# Patient Record
Sex: Male | Born: 1995 | Hispanic: No | Smoking: Never smoker
Health system: Southern US, Community
[De-identification: ages and names within clinical notes are randomized; demographics above are authoritative.]

---

## 2020-12-13 ENCOUNTER — Ambulatory Visit: Admit: 2020-12-13 | Discharge status: Absent without leave | Payer: Self-pay

## 2020-12-14 ENCOUNTER — Ambulatory Visit: Payer: Self-pay

## 2020-12-25 ENCOUNTER — Ambulatory Visit: Payer: Self-pay | Admitting: General Surgery

## 2021-01-30 ENCOUNTER — Institutional Professional Consult (permissible substitution): Payer: 59 | Admitting: Plastic Surgery

## 2021-07-04 ENCOUNTER — Ambulatory Visit (INDEPENDENT_AMBULATORY_CARE_PROVIDER_SITE_OTHER): Payer: 59 | Admitting: Orthopaedic Surgery

## 2021-07-04 ENCOUNTER — Other Ambulatory Visit: Payer: Self-pay

## 2021-07-04 ENCOUNTER — Encounter: Payer: Self-pay | Admitting: Orthopaedic Surgery

## 2021-07-04 ENCOUNTER — Ambulatory Visit: Payer: Self-pay

## 2021-07-04 VITALS — Ht 71.0 in | Wt 190.0 lb

## 2021-07-04 DIAGNOSIS — M545 Low back pain, unspecified: Secondary | ICD-10-CM | POA: Diagnosis not present

## 2021-07-04 MED ORDER — PREDNISONE 10 MG (21) PO TBPK: ORAL_TABLET | ORAL | 0 refills | Status: DC

## 2021-07-04 NOTE — Progress Notes (Signed)
Office Visit Note   Patient: Corey Holland           Date of Birth: 14-Oct-1995           MRN: [id number] Visit Date: 07/04/2021              Requested by: No referring provider defined for this encounter. PCP: Pcp, No   Assessment & Plan: Visit Diagnoses:  1. Acute left-sided low back pain, unspecified whether sciatica present     Plan: We will set patient up for some physical therapy evaluation and treatment for low back pain with spasms.  Recheck 4 weeks.  Follow-Up Instructions: Return in about 4 weeks (around 08/01/2021).   Orders:  Orders Placed This Encounter  Procedures   XR Lumbar Spine 2-3 Views   Meds ordered this encounter  Medications   predniSONE (STERAPRED UNI-PAK 21 TAB) 10 MG (21) TBPK tablet    Sig: TAKE 6,5,4,3,2,1 ONE TABLET LESS EACH DAY WITH FOOD.    Dispense:  21 tablet    Refill:  0      Procedures: No procedures performed   Clinical Data: No additional findings.   Subjective: Chief Complaint  Patient presents with   Lower Back - Pain    HPI 25 year old male seen with 1/55-month history of left radicular pain with low back pain.  He had more back pain now has more left calf pain.  He has had some trouble in his back in the past but nothing as severe or as sustained.  He is taken muscle relaxants ice over-the-counter medication without relief.  No bowel bladder symptoms no falling no chills or fever he got some relief with prednisone.  He rates his pain is moderate at times severe.  He spent multiple days in bed which tend to help.  No weight loss no smoking does not drink no cancer history.  Review of Systems all the systems are noncontributory.   Objective: Vital Signs: Ht 5\' 11"  (1.803 m)   Wt 190 lb (86.2 kg)   BMI 26.50 kg/m   Physical Exam Constitutional:      Appearance: He is well-developed.  HENT:     Head: Normocephalic and atraumatic.     Right Ear: External ear normal.     Left Ear: External ear normal.  Eyes:      Pupils: Pupils are equal, round, and reactive to light.  Neck:     Thyroid: No thyromegaly.     Trachea: No tracheal deviation.  Cardiovascular:     Rate and Rhythm: Normal rate.  Pulmonary:     Effort: Pulmonary effort is normal.     Breath sounds: No wheezing.  Abdominal:     General: Bowel sounds are normal.     Palpations: Abdomen is soft.  Musculoskeletal:     Cervical back: Neck supple.  Skin:    General: Skin is warm and dry.     Capillary Refill: Capillary refill takes less than 2 seconds.  Neurological:     Mental Status: He is alert and oriented to person, place, and time.  Psychiatric:        Behavior: Behavior normal.        Thought Content: Thought content normal.        Judgment: Judgment normal.    Ortho Exam patient can heel and toe walk.  Anterior tib gastrocsoleus is strong.  He has some tenderness over the left sacroiliac joint.  Negative FABER test negative logroll the hips knee and  ankle jerk are intact.  Sensation is intact over the dorsum of the foot.  No limitation of hip internal rotation.  Specialty Comments:  No specialty comments available.  Imaging: No results found.   PMFS History: There are no problems to display for this patient.  No past medical history on file.  No family history on file.   Social History   Occupational History   Not on file  Tobacco Use   Smoking status: Never   Smokeless tobacco: Never  Substance and Sexual Activity   Alcohol use: Not Currently   Drug use: Not on file   Sexual activity: Not on file

## 2021-07-11 ENCOUNTER — Encounter (HOSPITAL_COMMUNITY): Payer: Self-pay | Admitting: Physical Therapy

## 2021-07-11 ENCOUNTER — Other Ambulatory Visit: Payer: Self-pay

## 2021-07-11 ENCOUNTER — Ambulatory Visit (HOSPITAL_COMMUNITY): Payer: 59 | Attending: Orthopaedic Surgery | Admitting: Physical Therapy

## 2021-07-11 DIAGNOSIS — M545 Low back pain, unspecified: Secondary | ICD-10-CM | POA: Insufficient documentation

## 2021-07-11 NOTE — Patient Instructions (Signed)
Access Code: [email or web address] URL: [email or web address] Date: 07/11/2021 Prepared by: Georges Lynch  Exercises Prone Press Up - 3 x daily - 7 x weekly - 2 sets - 10 reps Supine 90/90 Sciatic Nerve Glide with Knee Flexion/Extension - 3 x daily - 7 x weekly - 2 sets - 10 reps Clamshell - 3 x daily - 7 x weekly - 2 sets - 10 reps Supine Figure 4 Piriformis Stretch - 3 x daily - 7 x weekly - 1 sets - 3 reps - 30 second hold

## 2021-07-11 NOTE — Therapy (Signed)
[facility] [address] [place], [place], [address] Phone: [phone]   Fax:  [phone]  Physical Therapy Evaluation  Patient Details  Name: Corey Holland MRN: [id number] Date of Birth: 1996-02-23 Referring Provider (PT): Annell Greening MD   Encounter Date: 07/11/2021   PT End of Session - 07/11/21 1617     Visit Number 1    Number of Visits 8    Date for PT Re-Evaluation 08/08/21    Authorization Type [employer] (30 VL)    Authorization - Visit Number 1    Authorization - Number of Visits 30    PT Start Time [phone]    PT Stop Time 1035    PT Time Calculation (min) 47 min    Activity Tolerance Patient tolerated treatment well    Behavior During Therapy [facility] for tasks assessed/performed             History reviewed. No pertinent past medical history.  History reviewed. No pertinent surgical history.  There were no vitals filed for this visit.    Subjective Assessment - 07/11/21 0958     Subjective Patient presents to therapy with complaint of LBP. He reports about 3 months ago he went on a trip to [place] and when he returned he had back pain. His pain worsened over time. He notes the pain was the worst he ever had. He was prescribed muscle relaxers which did not help very much. It has gotten better sine this time but still painful. Still radiates to LT leg but not no longer down to calf. He was recently prescribed prednisone which he says has helped about 70%. He reports no other mechanism of injury.    Limitations Standing;Walking;House hold activities    Patient Stated Goals "Get as close to before this without the pain"    Currently in Pain? No/denies   No pain, just tightness in LT low back and LT calf               [facility] PT Assessment - 07/11/21 0001       Assessment   Medical Diagnosis LBP    Referring Provider (PT) Annell Greening MD    Prior Therapy No      Precautions   Precautions None      Balance Screen    Has the patient fallen in the past 6 months No      Home Environment   Living Environment Private residence      Prior Function   Level of Independence Independent    Vocation Full time employment    Vocation Requirements Operates online business      Cognition   Overall Cognitive Status Within Functional Limits for tasks assessed      Observation/Other Assessments   Focus on Therapeutic Outcomes (FOTO)  49% function      Posture/Postural Control   Posture/Postural Control Postural limitations    Postural Limitations Decreased lumbar lordosis      ROM / Strength   AROM / PROM / Strength AROM;Strength      AROM   Overall AROM Comments mod restriction in LT hip ER    AROM Assessment Site Lumbar    Lumbar Flexion 90% limited    Lumbar Extension 50% limited    Lumbar - Right Rotation WFL    Lumbar - Left Rotation [facility]      Strength   Strength Assessment Site Hip;Knee    Right/Left Hip Right;Left    Right Hip  Flexion 4+/5    Right Hip Extension 4/5   pain in lumbar   Right Hip ABduction 4/5    Left Hip Flexion 4+/5    Left Hip Extension 4/5   pain in lumbar   Left Hip ABduction 4-/5    Right/Left Knee Right;Left    Right Knee Extension 3-/5    Left Knee Extension 3-/5      Palpation   Palpation comment Min TTP about LT piriformis                        Objective measurements completed on examination: See above findings.       [facility] Adult PT Treatment/Exercise - 07/11/21 0001       Exercises   Exercises Lumbar      Lumbar Exercises: Stretches   Piriformis Stretch Left;2 reps;30 seconds   abduction stretch     Lumbar Exercises: Sidelying   Clam Left;20 reps      Lumbar Exercises: Quadruped   Other Quadruped Lumbar Exercises prone press ups 2 x 10 (improved)                     PT Education - 07/11/21 1002     Education Details on evaluation findings, POC and HEP    Person(s) Educated Patient    Methods Explanation;Handout     Comprehension Verbalized understanding              PT Short Term Goals - 07/11/21 1623       PT SHORT TERM GOAL #1   Title Patient will be independent with initial HEP and self-management strategies to improve functional outcomes    Time 2    Period Weeks    Status New    Target Date 07/25/21               PT Long Term Goals - 07/11/21 1623       PT LONG TERM GOAL #1   Title Patient will be independent with advance HEP and self-management strategies to improve functional outcomes    Time 4    Period Weeks    Status New    Target Date 08/08/21      PT LONG TERM GOAL #2   Title Patient will report at least 80% overall improvement in subjective complaint to indicate improvement in ability to perform ADLs.    Time 4    Period Weeks    Status New    Target Date 08/08/21      PT LONG TERM GOAL #3   Title Patient will improve FOTO score to predicted value to indicate improvement in functional outcomes    Time 4    Period Weeks    Status New    Target Date 08/08/21      PT LONG TERM GOAL #4   Title Patient will have equal to or > 4+/5 MMT throughout BLE to improve ability to perform functional mobility, stair ambulation and ADLs.    Time 4    Period Weeks    Status New    Target Date 08/08/21                    Plan - 07/11/21 1618     Clinical Impression Statement Patient is a 25 y.o. male who presents to physical therapy with complaint of LBP. Patient demonstrates decreased strength, ROM restriction, increased tenderness to palpation and postural abnormalities which are likely contributing  to symptoms of pain and are negatively impacting patient ability to perform ADLs and functional mobility tasks. Patient will benefit from skilled physical therapy services to address these deficits to reduce pain and improve level of function with ADLs and functional mobility tasks.    Examination-Activity Limitations Stand;Locomotion Level;Squat;Lift;Sit     Examination-Participation Restrictions Yard Work;Community Activity;Occupation    Stability/Clinical Decision Making Stable/Uncomplicated    Clinical Decision Making Low    Rehab Potential Good    PT Frequency 2x / week    PT Duration 4 weeks    PT Treatment/Interventions ADLs/Self Care Home Management;Aquatic Therapy;Biofeedback;Cryotherapy;Traction;Functional mobility training;Stair training;Neuromuscular re-education;Manual techniques;Passive range of motion;Dry needling;Energy conservation;Patient/family education;Therapeutic activities;Ultrasound;Parrafin;Fluidtherapy;Moist Heat;Iontophoresis 4mg /ml Dexamethasone;Gait training;DME Instruction;Contrast Bath;Electrical Stimulation;Therapeutic exercise;Balance training;Orthotic Fit/Training;Compression bandaging;Scar mobilization;Taping;Vasopneumatic Device;Visual/perceptual remediation/compensation;Spinal Manipulations;Joint Manipulations    PT Next Visit Plan Review HEP. Progress extension based activity for decreased lumbar pain as indicated. Core and glute strengthening as able.    PT Home Exercise Plan Eval: prone press ups, piriformis stretch, sciatic nerve glides, clams    Consulted and Agree with Plan of Care Patient             Patient will benefit from skilled therapeutic intervention in order to improve the following deficits and impairments:  Pain, Improper body mechanics, Abnormal gait, Increased fascial restricitons, Postural dysfunction, Decreased activity tolerance, Decreased range of motion, Decreased strength, Impaired flexibility, Difficulty walking  Visit Diagnosis: Low back pain, unspecified back pain laterality, unspecified chronicity, unspecified whether sciatica present     Problem List There are no problems to display for this patient.  4:27 PM, 07/11/21 13/10/22 PT DPT  Physical Therapist with [facility]  [facility]  [phone]   [facility] [address] [place], [place], [place] Phone: [phone]   Fax:  [phone]  Name: Osa Campoli MRN: Allene Pyo Date of Birth: 1996/02/24

## 2021-07-15 ENCOUNTER — Telehealth (HOSPITAL_COMMUNITY): Payer: Self-pay

## 2021-07-16 ENCOUNTER — Encounter (HOSPITAL_COMMUNITY): Payer: 59

## 2021-07-18 ENCOUNTER — Ambulatory Visit (HOSPITAL_COMMUNITY): Payer: 59 | Admitting: Physical Therapy

## 2021-07-22 ENCOUNTER — Ambulatory Visit
Admission: RE | Admit: 2021-07-22 | Discharge: 2021-07-22 | Disposition: A | Discharge status: Home / Self Care | Payer: 59 | Source: Ambulatory Visit | Attending: Internal Medicine | Admitting: Internal Medicine

## 2021-07-22 ENCOUNTER — Other Ambulatory Visit: Payer: Self-pay

## 2021-07-22 VITALS — BP 118/77 | HR 90 | Temp 99.0°F | Resp 16

## 2021-07-22 DIAGNOSIS — G8929 Other chronic pain: Secondary | ICD-10-CM

## 2021-07-22 DIAGNOSIS — M549 Dorsalgia, unspecified: Secondary | ICD-10-CM

## 2021-07-22 MED ORDER — PREDNISONE 10 MG (21) PO TBPK: ORAL_TABLET | ORAL | 0 refills | Status: DC

## 2021-07-22 NOTE — ED Triage Notes (Signed)
PT reports history of DDD. States he has been in bed for 3 months, per patient, he can hardly walk due to pain. Reports he would like to start physical therapy, but has been told he needs prednisone before starting.

## 2021-07-22 NOTE — Discharge Instructions (Addendum)
Please take medications as prescribed Follow up with the Physical therapist and Orthopedic Surgery for further evaluation.

## 2021-07-22 NOTE — ED Provider Notes (Signed)
[facility]    CSN: [id number] Arrival date & time: 07/22/21  1459      History   Chief Complaint Chief Complaint  Patient presents with   Appointment    Back pain    HPI Corey Holland is a 25 y.o. male with a history of degenerative disc disease comes to urgent care for evaluation.  Patient started physical therapy and he was advised to come for evaluation.  Patient has previously had great success with tapering dose of prednisone followed by physical therapy.  Over the past 3 months patient has had significant back pain.  He started physical therapy a week ago and was advised to come to urgent care to be evaluated and possibly giving steroids so he can continue with physical therapy.  This regimen has worked well for him in the past.  He was evaluated by orthopedic surgery earlier this month.  Orthopedic surgery recommended physical therapy for now.  No weakness in the lower extremities.  Pain is in the lower back and shoots down both legs.  No numbness or tingling.  No trauma to the back.  No bowel or bladder problems.Corey Holland   HPI  History reviewed. No pertinent past medical history.  There are no problems to display for this patient.   History reviewed. No pertinent surgical history.     Home Medications    Prior to Admission medications   Medication Sig Start Date End Date Taking? Authorizing Provider  predniSONE (STERAPRED UNI-PAK 21 TAB) 10 MG (21) TBPK tablet TAKE 6,5,4,3,2,1 ONE TABLET LESS EACH DAY WITH FOOD. 07/22/21   Ralpheal Zappone, Britta Mccreedy, MD    Family History No family history on file.  Social History Social History   Tobacco Use   Smoking status: Never   Smokeless tobacco: Never  Substance Use Topics   Alcohol use: Not Currently     Allergies   Patient has no known allergies.   Review of Systems Review of Systems As per HPI  Physical Exam Triage Vital Signs ED Triage Vitals  Enc Vitals Group     BP 07/22/21 1557 118/77     Pulse Rate  07/22/21 1557 90     Resp 07/22/21 1557 16     Temp 07/22/21 1557 99 F (37.2 C)     Temp Source 07/22/21 1557 Oral     SpO2 07/22/21 1557 98 %     Weight --      Height --      Head Circumference --      Peak Flow --      Pain Score 07/22/21 1554 8     Pain Loc --      Pain Edu? --      Excl. in GC? --    No data found.  Updated Vital Signs BP 118/77   Pulse 90   Temp 99 F (37.2 C) (Oral)   Resp 16   SpO2 98%   Visual Acuity Right Eye Distance:   Left Eye Distance:   Bilateral Distance:    Right Eye Near:   Left Eye Near:    Bilateral Near:     Physical Exam Vitals and nursing note reviewed.  Constitutional:      General: He is not in acute distress.    Appearance: He is not ill-appearing.  Cardiovascular:     Rate and Rhythm: Normal rate and regular rhythm.  Musculoskeletal:        General: No swelling, tenderness, deformity or signs of  injury. Normal range of motion.     Right lower leg: No edema.     Left lower leg: No edema.  Skin:    General: Skin is warm.  Neurological:     General: No focal deficit present.     Mental Status: He is alert and oriented to person, place, and time.     Motor: No weakness.     Gait: Gait normal.     Deep Tendon Reflexes: Reflexes normal.     UC Treatments / Results  Labs (all labs ordered are listed, but only abnormal results are displayed) Labs Reviewed - No data to display  EKG   Radiology No results found.  Procedures Procedures (including critical care time)  Medications Ordered in UC Medications - No data to display  Initial Impression / Assessment and Plan / UC Course  I have reviewed the triage vital signs and the nursing notes.  Pertinent labs & imaging results that were available during my care of the patient were reviewed by me and considered in my medical decision making (see chart for details).     1.  Chronic back pain of greater than 3 months duration: Tapering dose prednisone Gentle  range of motion exercises Continue physical therapy Follow-up with orthopedic surgery if no improvement in symptoms. Final Clinical Impressions(s) / UC Diagnoses   Final diagnoses:  Chronic back pain greater than 3 months duration     Discharge Instructions      Please take medications as prescribed Follow up with the Physical therapist and Orthopedic Surgery for further evaluation.   ED Prescriptions     Medication Sig Dispense Auth. Provider   predniSONE (STERAPRED UNI-PAK 21 TAB) 10 MG (21) TBPK tablet TAKE 6,5,4,3,2,1 ONE TABLET LESS EACH DAY WITH FOOD. 21 tablet Rashmi Tallent, Britta Mccreedy, MD      PDMP not reviewed this encounter.   Merrilee Jansky, MD 07/22/21 [phone]

## 2021-07-30 ENCOUNTER — Ambulatory Visit (HOSPITAL_COMMUNITY): Payer: 59 | Admitting: Physical Therapy

## 2021-08-01 ENCOUNTER — Ambulatory Visit (INDEPENDENT_AMBULATORY_CARE_PROVIDER_SITE_OTHER): Payer: 59 | Admitting: Orthopaedic Surgery

## 2021-08-01 ENCOUNTER — Encounter: Payer: Self-pay | Admitting: Orthopaedic Surgery

## 2021-08-01 ENCOUNTER — Telehealth: Payer: Self-pay

## 2021-08-01 ENCOUNTER — Encounter (HOSPITAL_COMMUNITY): Payer: 59 | Admitting: Physical Therapy

## 2021-08-01 DIAGNOSIS — M545 Low back pain, unspecified: Secondary | ICD-10-CM | POA: Diagnosis not present

## 2021-08-01 NOTE — Telephone Encounter (Signed)
Order entered. To be scheduled by [facility] office staff.

## 2021-08-01 NOTE — Telephone Encounter (Signed)
Pt called and he stating that he would like to follow through and get a MRI set up.   Please advise

## 2021-08-01 NOTE — Telephone Encounter (Signed)
Please advise 

## 2021-08-01 NOTE — Progress Notes (Signed)
Office Visit Note       Patient: Corey Holland           Date of Birth: 29-Apr-1996           MRN: [id number] Visit Date: 08/01/2021              Requested by: No referring provider defined for this encounter. PCP: Pcp, No   Assessment & Plan: Visit Diagnoses:  1. Low back pain, unspecified back pain laterality, unspecified chronicity, unspecified whether sciatica present     Plan: We discussed patient he may have a disc bulge with intermittent symptoms.  Currently he is doing better with less pain than when he had severe back spasms and could not stand and ambulate and could barely make it to the bathroom.  We discussed neck step would be consideration imaging studies if he gets recurrence of symptoms.  Follow-Up Instructions: Return if symptoms worsen or fail to improve.   Orders:  No orders of the defined types were placed in this encounter.  No orders of the defined types were placed in this encounter.     Procedures: No procedures performed   Clinical Data: No additional findings.   Subjective: Chief Complaint  Patient presents with   Lower Back - Pain    HPI 25 year old male returns with ongoing problems with low back pain.  Went to urgent care 07/22/2021.  He states that he had only been out of bed for 2 or 2 and half hours due to severe back pain and left leg pain.  Patient got about 80% relief from the prednisone.  Patient's had physical therapy, muscle relaxants, over-the-counter medication, anti-inflammatories.  No bowel bladder symptoms.  Review of Systemspdated unchanged from 07/04/2213 point system.   Objective: Vital Signs: Ht 5\' 11"  (1.803 m)   Wt 190 lb (86.2 kg)   BMI 26.50 kg/m   Physical Exam Constitutional:      Appearance: He is well-developed.  HENT:     Head: Normocephalic and atraumatic.     Right Ear: External ear normal.     Left Ear: External ear normal.  Eyes:     Pupils: Pupils are equal, round, and reactive to light.  Neck:      Thyroid: No thyromegaly.     Trachea: No tracheal deviation.  Cardiovascular:     Rate and Rhythm: Normal rate.  Pulmonary:     Effort: Pulmonary effort is normal.     Breath sounds: No wheezing.  Abdominal:     General: Bowel sounds are normal.     Palpations: Abdomen is soft.  Musculoskeletal:     Cervical back: Neck supple.  Skin:    General: Skin is warm and dry.     Capillary Refill: Capillary refill takes less than 2 seconds.  Neurological:     Mental Status: He is alert and oriented to person, place, and time.  Psychiatric:        Behavior: Behavior normal.        Thought Content: Thought content normal.        Judgment: Judgment normal.    Ortho Exam patient has pain with straight leg raising on the left at 70 degrees.  He is able to heel and toe walk.  Gastrocsoleus is strong right and left.  There is sciatic notch tenderness on the left.  Pulses are 2+.  Sensation is intact over the dorsum of the foot.  Specialty Comments:  No specialty comments available.  Imaging: No  results found.   PMFS History: Patient Active Problem List   Diagnosis Date Noted   Low back pain 08/05/2021   No past medical history on file.  No family history on file.  No past surgical history on file. Social History   Occupational History   Not on file  Tobacco Use   Smoking status: Never   Smokeless tobacco: Never  Substance and Sexual Activity   Alcohol use: Not Currently   Drug use: Not on file   Sexual activity: Not on file

## 2021-08-05 DIAGNOSIS — M545 Low back pain, unspecified: Secondary | ICD-10-CM | POA: Insufficient documentation

## 2021-08-06 ENCOUNTER — Encounter (HOSPITAL_COMMUNITY): Payer: 59 | Admitting: Physical Therapy

## 2021-08-08 ENCOUNTER — Encounter (HOSPITAL_COMMUNITY): Payer: 59 | Admitting: Physical Therapy

## 2021-08-16 ENCOUNTER — Other Ambulatory Visit: Payer: Self-pay

## 2021-08-16 ENCOUNTER — Ambulatory Visit (HOSPITAL_COMMUNITY)
Admission: RE | Admit: 2021-08-16 | Discharge: 2021-08-16 | Disposition: A | Discharge status: Home / Self Care | Payer: 59 | Source: Ambulatory Visit | Attending: Orthopaedic Surgery | Admitting: Orthopaedic Surgery

## 2021-08-16 DIAGNOSIS — M545 Low back pain, unspecified: Secondary | ICD-10-CM | POA: Insufficient documentation

## 2021-08-16 IMAGING — MR MR LUMBAR SPINE W/O CM
5 series · 31 of 48 positions shown · non-contrast
Comparison: None.

CLINICAL DATA: Low back pain, greater than 6 weeks duration despite
conservative treatment. Pain radiates to the left lower extremity.

EXAM:
MRI LUMBAR SPINE WITHOUT CONTRAST
TECHNIQUE: Multiplanar, multisequence MR imaging of the lumbar spine was
performed. No intravenous contrast was administered.

[Series 5: T1 · sagittal · 4.0mm · 0.81mm/px · 6 of 17 slices shown (1 of 2)]
[im 1/17]
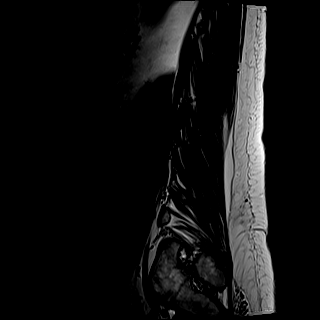
[im 4/17]
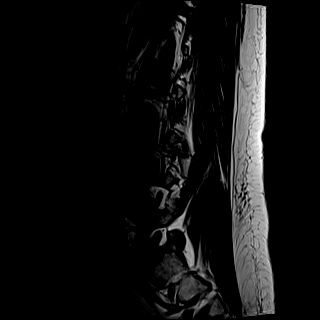
[im 7/17]
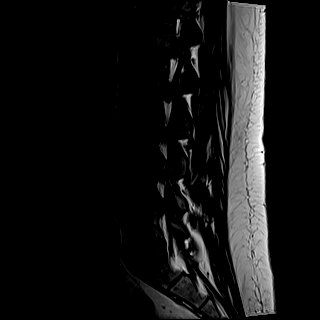
[im 10/17]
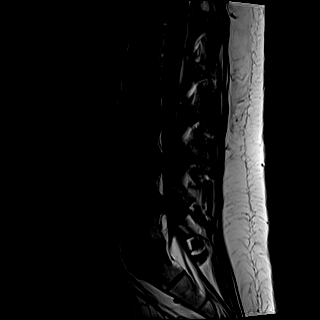
[im 13/17]
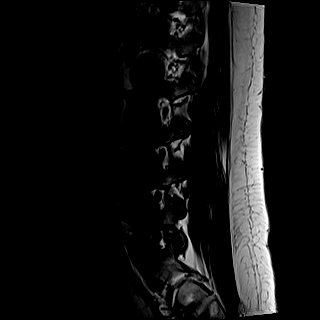
[im 17/17]
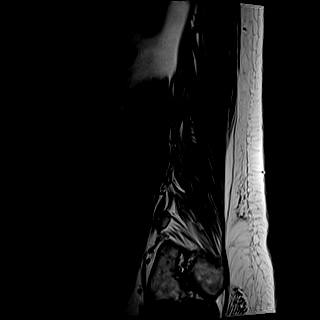

[Series 6: T2 · sagittal · 4.0mm · 0.81mm/px · 6 of 17 slices shown (1 of 2)]
[im 1/17]
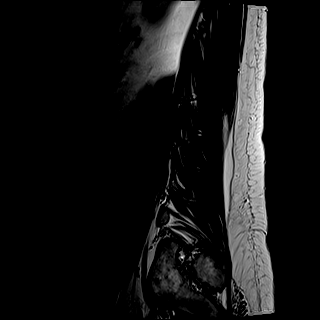
[im 4/17]
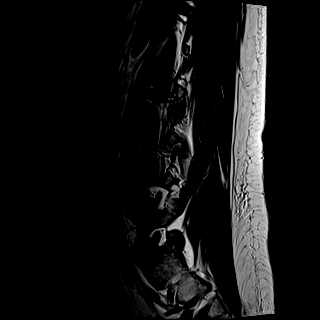
[im 7/17]
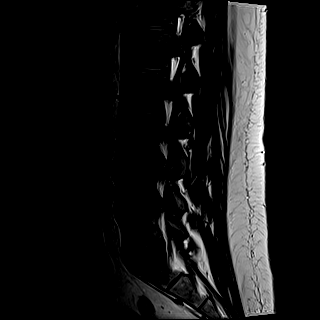
[im 10/17]
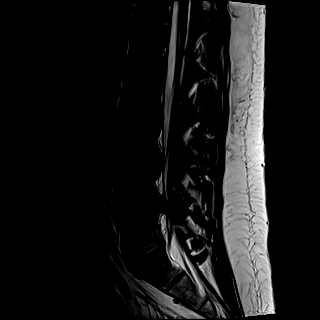
[im 13/17]
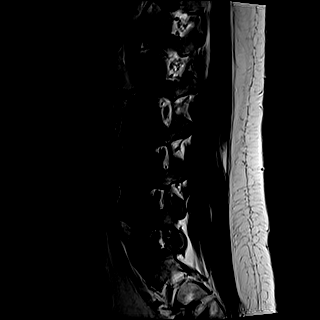
[im 17/17]
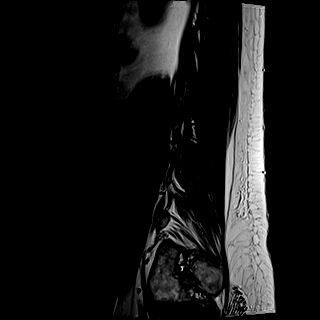

[Series 7: STIR · sagittal · 4.0mm · 0.51mm/px · 1 of 17 slices shown]
[im 1/17]
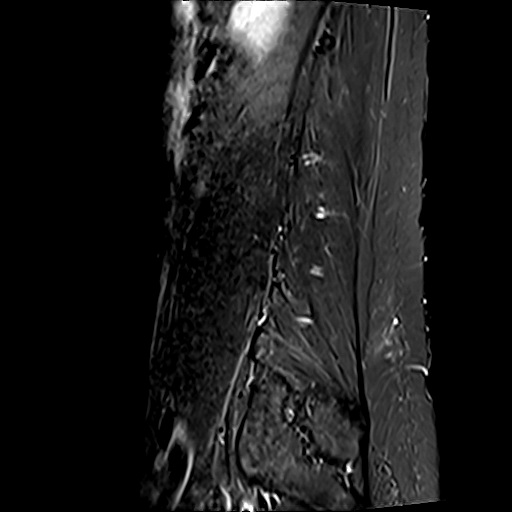

[Series 8: T2 · axial · 4.0mm · 0.62mm/px · z∈[-116,+128]mm · 9 of 45 slices shown (2 of 2)]
[im 1/45]
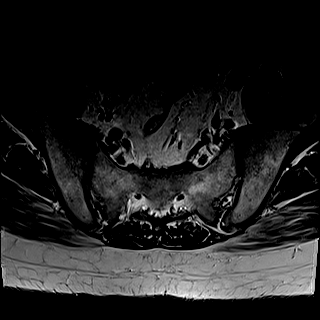
[im 7/45]
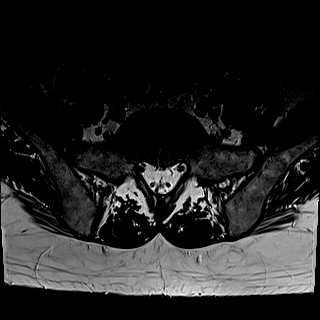
[im 13/45]
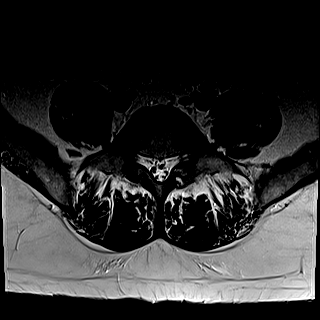
[im 19/45]
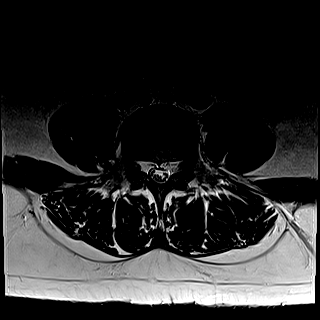
[im 23/45]
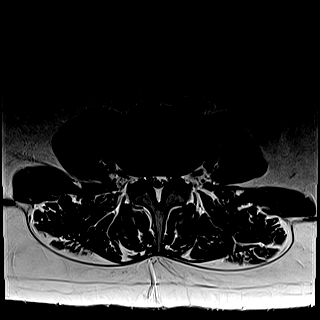
[im 26/45]
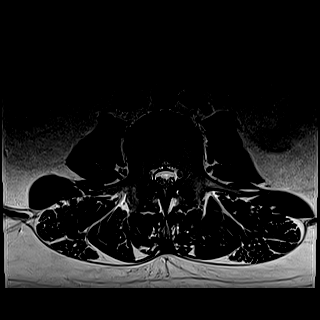
[im 32/45]
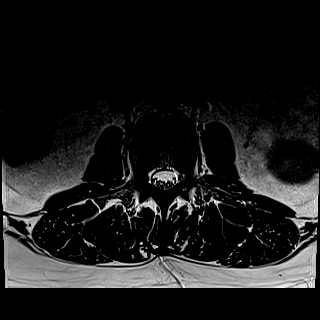
[im 38/45]
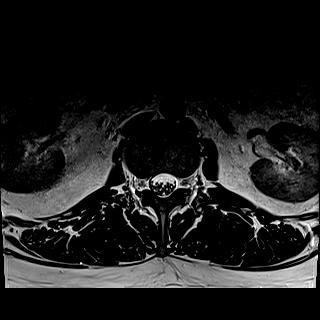
[im 45/45]
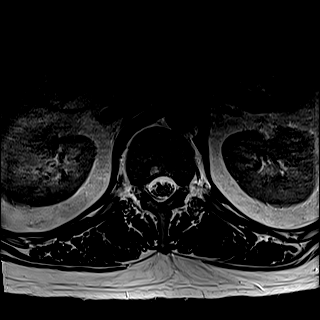

[Series 9: T1 · axial · 4.0mm · 0.39mm/px · z∈[-116,+128]mm · 9 of 45 slices shown (2 of 2)]
[im 1/45]
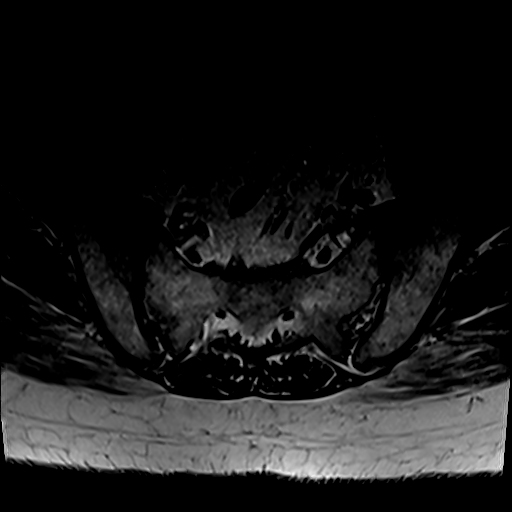
[im 7/45]
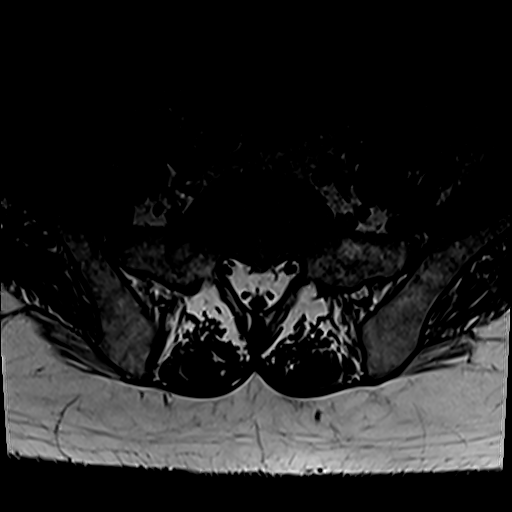
[im 13/45]
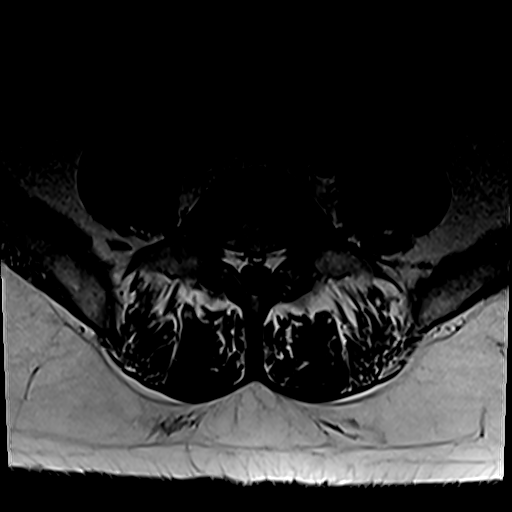
[im 19/45]
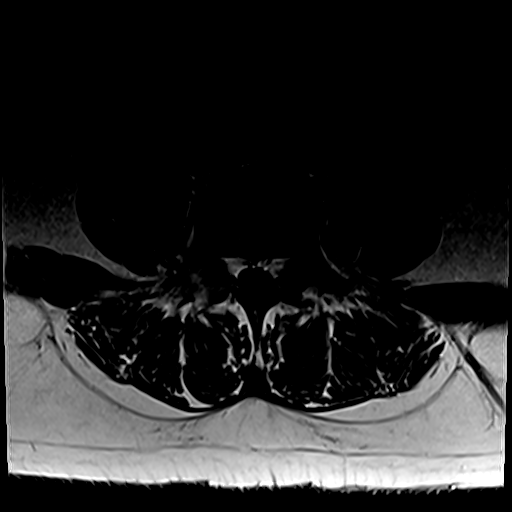
[im 23/45]
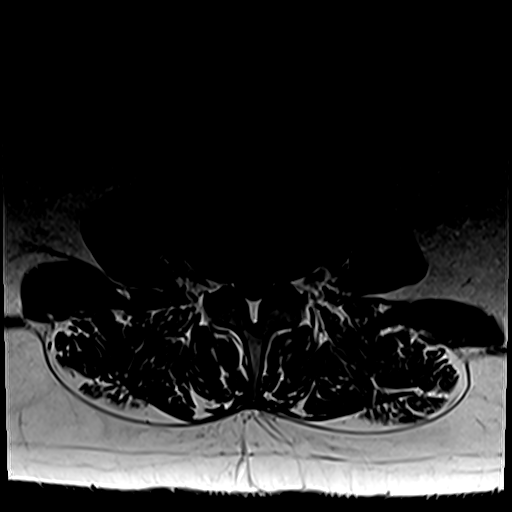
[im 26/45]
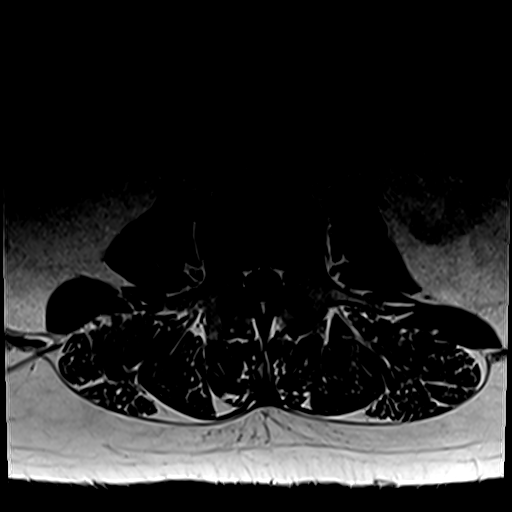
[im 32/45]
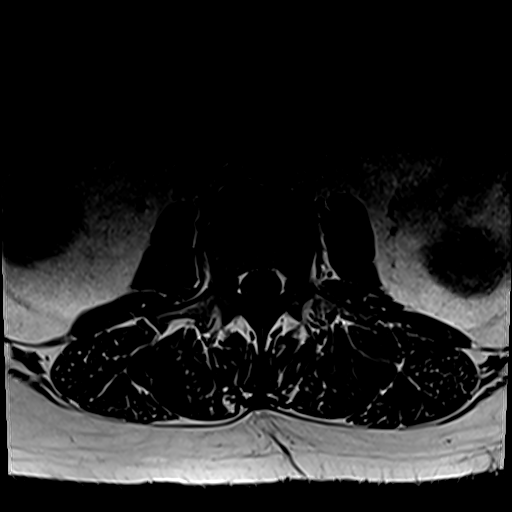
[im 38/45]
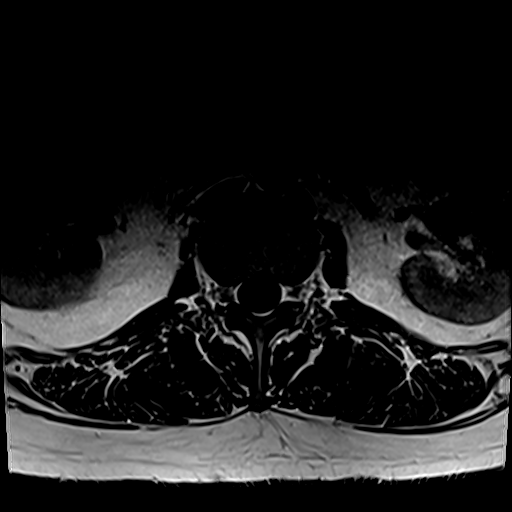
[im 45/45]
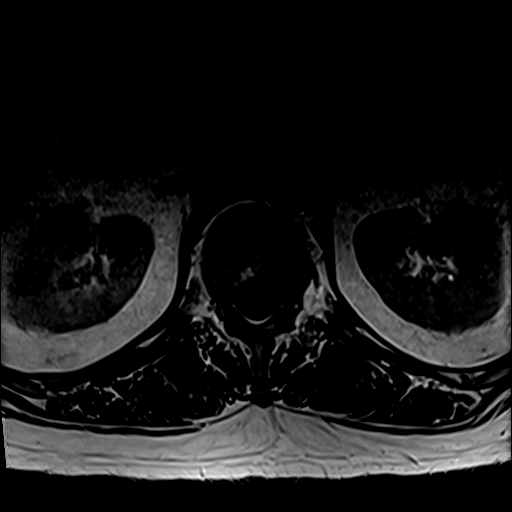

[31 of 48 positions shown; findings below may reference images not displayed]

FINDINGS: Segmentation: 5 lumbar type vertebral bodies assumed. L5 has some
transitional features. Correlation with this numbering scheme would
be important should intervention be contemplated.

Alignment: Normal

Vertebrae: No fracture or focal bone lesion. Chronic endplate
Schmorl's nodes at superior L4 without active edema.

Conus medullaris and cauda equina: Conus extends to the L1 level.
Conus and cauda equina appear normal.

Paraspinal and other soft tissues: Negative

Disc levels:

No abnormality at L2-3 or above.

L3-4: Desiccation and mild bulging of the disc. Mild narrowing of
the lateral recesses but no apparent neural compression.

L4-5: Desiccation of the disc with a broad-based disc herniation
more prominent in the left posterolateral direction. Slight caudal
down turning to the left of midline. Mild facet and ligamentous
prominence. Stenosis of both lateral recesses, worse on the left
than the right. Neural compression could occur on either side,
particularly the left.

L5-S1: Normal appearance of the disc. Minimal facet hypertrophy on
the left. No stenosis.
IMPRESSION: The dominant findings are at L4-5. The disc is degenerated and there
is a broad-based disc herniation more prominent in the left
posterolateral direction, with slight caudal down turning to the
left of midline. Mild facet and ligamentous prominence. There is
stenosis of both lateral recesses, left more than right. Neural
compression could occur on either side, more likely the left.

At L3-4, there is desiccation and mild bulging of the disc. There is
mild narrowing of the lateral recesses but no visible neural
compression

## 2021-08-22 ENCOUNTER — Other Ambulatory Visit: Payer: Self-pay

## 2021-08-22 ENCOUNTER — Ambulatory Visit (INDEPENDENT_AMBULATORY_CARE_PROVIDER_SITE_OTHER): Payer: 59 | Admitting: Podiatry

## 2021-08-22 DIAGNOSIS — L600 Ingrowing nail: Secondary | ICD-10-CM

## 2021-08-22 MED ORDER — NEOMYCIN-POLYMYXIN-HC 1 % OT SOLN: OTIC | 0 refills | Status: DC

## 2021-08-22 NOTE — Patient Instructions (Signed)

## 2021-08-23 NOTE — Progress Notes (Signed)
°  Subjective:  Patient ID: Corey Holland, male    DOB: 1996/07/17,  MRN: [id number]  Ingrown toenail left hallux medial border  25 y.o. male presents with the above complaint. History confirmed with patient.  Previously had a removed 2 and half years ago.  Was told it would never come back after they took it out.  Objective:  Physical Exam: warm, good capillary refill, no trophic changes or ulcerative lesions, normal DP and PT pulses, and normal sensory exam. Left Foot:  Hallux medial border ingrown without evidence of paronychia Assessment:   1. Ingrowing left great toenail      Plan:  Patient was evaluated and treated and all questions answered.    Ingrown Nail, left -Patient elects to proceed with minor surgery to remove ingrown toenail today. Consent reviewed and signed by patient. -Ingrown nail excised. See procedure note. -Educated on post-procedure care including soaking. Written instructions provided and reviewed. -Patient to follow up in 2 weeks for nail check. -We also discussed the option of a surgical matricectomy if this does not work this time  Procedure: Excision of Ingrown Toenail Location: Left 1st toe medial nail borders. Anesthesia: Lidocaine 1% plain; 1.5 mL and Marcaine 0.5% plain; 1.5 mL, digital block. Skin Prep: Betadine. Dressing: Silvadene; telfa; dry, sterile, compression dressing. Technique: Following skin prep, the toe was exsanguinated and a tourniquet was secured at the base of the toe. The affected nail border was freed, split with a nail splitter, and excised. Chemical matrixectomy was then performed with phenol and irrigated out with alcohol. The tourniquet was then removed and sterile dressing applied. Disposition: Patient tolerated procedure well. Patient to return in 2 weeks for follow-up.    Return in about 2 weeks (around 09/05/2021).

## 2021-09-09 ENCOUNTER — Telehealth: Payer: Self-pay | Admitting: *Deleted

## 2021-09-09 NOTE — Telephone Encounter (Signed)
Patient is calling to cancel his upcoming appointment, please call back to reschedule.

## 2021-09-10 ENCOUNTER — Ambulatory Visit: Payer: Self-pay | Admitting: Podiatry

## 2021-09-30 ENCOUNTER — Telehealth: Payer: Self-pay | Admitting: Radiology

## 2021-09-30 NOTE — Telephone Encounter (Signed)
Patient had MRI on 08/16/2021. We will get ROV scheduled for MRI review. Thanks.

## 2021-09-30 NOTE — Telephone Encounter (Signed)
Please see message from [place] in [place] office and advise.  Patient:  Corey Holland  Ph #:  [phone]  DOB:  [birth year]     He would like to be referred to Dr. Alvester Morin for [facility] please

## 2021-10-10 ENCOUNTER — Telehealth: Payer: Self-pay | Admitting: Radiology

## 2021-10-10 ENCOUNTER — Ambulatory Visit (INDEPENDENT_AMBULATORY_CARE_PROVIDER_SITE_OTHER): Payer: Self-pay | Admitting: Orthopaedic Surgery

## 2021-10-10 ENCOUNTER — Encounter: Payer: Self-pay | Admitting: Orthopaedic Surgery

## 2021-10-10 DIAGNOSIS — M5116 Intervertebral disc disorders with radiculopathy, lumbar region: Secondary | ICD-10-CM | POA: Insufficient documentation

## 2021-10-10 NOTE — Telephone Encounter (Signed)
Charlotte from [place] office and I have been calling patient to schedule MRI review in [place] office. If patient returns call, please schedule visit with Dr. Lorin Mercy to review MRI. At that point, he will decide about referral to Dr. Ernestina Patches. Thanks.

## 2021-10-10 NOTE — Progress Notes (Signed)
Office Visit Note   Patient: Corey Holland           Date of Birth: [birth year]           MRN: [id number] Visit Date: 10/10/2021              Requested by: Corky Downs, MD [address] [place],  [place] [address] PCP: Corky Downs, MD   Assessment & Plan: Visit Diagnoses:  1. Lumbar disc herniation with radiculopathy            Left L4-5  Plan: Patient has some short pedicles and there is disc degeneration L4-5 with broad-based disc herniation slight caudal downturn with some compression on the left side consistent with this left leg radicular symptoms.  He is actually moving fairly well has been active and is not in severe pain.  He will call if he like to have an epidural.  If he develops severe pain and progressive weakness then we discussed microdiscectomy as an option.  He has had symptoms now for 5 to 6 months fairly stable.  If he calls we can proceed with epidural for the left L4-5 disc herniation.  Skin reviewed pathophysiology discussed, copy of the scan given to the patient.  Questions elicited and answered.  Follow-Up Instructions: No follow-ups on file.   Orders:  No orders of the defined types were placed in this encounter.  No orders of the defined types were placed in this encounter.     Procedures: No procedures performed   Clinical Data: No additional findings.   Subjective: Chief Complaint  Patient presents with   Lower Back - Follow-up    MRI review    HPI patient turns for ongoing problems with back pain left leg pain.  He states has been doing exercises every 2 hours a day but stopped 1 week ago and has not really noticed much difference.  He states when he did the exercises that hurt some but after an hour and a half he noticed some benefit.  MRI scan has been obtained and is available for review lumbar spine.  No bowel bladder symptoms no fever or chills.  Review of Systems all other systems noncontributory to HPI.     Objective: Vital Signs: Ht  5\' 11"  (1.803 m)    Wt 190 lb (86.2 kg)    BMI 26.50 kg/m   Physical Exam Constitutional:      Appearance: He is well-developed.  HENT:     Head: Normocephalic and atraumatic.     Right Ear: External ear normal.     Left Ear: External ear normal.  Eyes:     Pupils: Pupils are equal, round, and reactive to light.  Neck:     Thyroid: No thyromegaly.     Trachea: No tracheal deviation.  Cardiovascular:     Rate and Rhythm: Normal rate.  Pulmonary:     Effort: Pulmonary effort is normal.     Breath sounds: No wheezing.  Abdominal:     General: Bowel sounds are normal.     Palpations: Abdomen is soft.  Musculoskeletal:     Cervical back: Neck supple.  Skin:    General: Skin is warm and dry.     Capillary Refill: Capillary refill takes less than 2 seconds.  Neurological:     Mental Status: He is alert and oriented to person, place, and time.  Psychiatric:        Behavior: Behavior normal.  Thought Content: Thought content normal.        Judgment: Judgment normal.    Ortho Exam patient is amatory gets from sitting standing.  L5 nerve root motor function is active.  No gastrocsoleus weakness.  No rash or exposed skin normal pulses.  Specialty Comments:  No specialty comments available.  Imaging: Narrative & Impression  CLINICAL DATA:  Low back pain, greater than 6 weeks duration despite conservative treatment. Pain radiates to the left lower extremity.   EXAM: MRI LUMBAR SPINE WITHOUT CONTRAST   TECHNIQUE: Multiplanar, multisequence MR imaging of the lumbar spine was performed. No intravenous contrast was administered.   COMPARISON:  None.   FINDINGS: Segmentation: 5 lumbar type vertebral bodies assumed. L5 has some transitional features. Correlation with this numbering scheme would be important should intervention be contemplated.   Alignment: Normal   Vertebrae: No fracture or focal bone lesion. Chronic endplate Schmorl's nodes at superior L4 without  active edema.   Conus medullaris and cauda equina: Conus extends to the L1 level. Conus and cauda equina appear normal.   Paraspinal and other soft tissues: Negative   Disc levels:   No abnormality at L2-3 or above.   L3-4: Desiccation and mild bulging of the disc. Mild narrowing of the lateral recesses but no apparent neural compression.   L4-5: Desiccation of the disc with a broad-based disc herniation more prominent in the left posterolateral direction. Slight caudal down turning to the left of midline. Mild facet and ligamentous prominence. Stenosis of both lateral recesses, worse on the left than the right. Neural compression could occur on either side, particularly the left.   L5-S1: Normal appearance of the disc. Minimal facet hypertrophy on the left. No stenosis.   IMPRESSION: The dominant findings are at L4-5. The disc is degenerated and there is a broad-based disc herniation more prominent in the left posterolateral direction, with slight caudal down turning to the left of midline. Mild facet and ligamentous prominence. There is stenosis of both lateral recesses, left more than right. Neural compression could occur on either side, more likely the left.   At L3-4, there is desiccation and mild bulging of the disc. There is mild narrowing of the lateral recesses but no visible neural compression     Electronically Signed   By: Paulina Fusi M.D.   On: 08/18/2021 16:54     PMFS History: Patient Active Problem List   Diagnosis Date Noted   Lumbar disc herniation with radiculopathy 10/10/2021   Low back pain 08/05/2021   No past medical history on file.  No family history on file.  No past surgical history on file. Social History   Occupational History   Not on file  Tobacco Use   Smoking status: Never   Smokeless tobacco: Never  Substance and Sexual Activity   Alcohol use: Not Currently   Drug use: Not on file   Sexual activity: Not on file

## 2021-11-04 ENCOUNTER — Encounter: Payer: Self-pay | Admitting: Gastroenterology

## 2021-11-14 ENCOUNTER — Other Ambulatory Visit: Payer: Self-pay

## 2021-11-22 ENCOUNTER — Ambulatory Visit (INDEPENDENT_AMBULATORY_CARE_PROVIDER_SITE_OTHER): Payer: 59 | Admitting: Gastroenterology

## 2021-11-22 ENCOUNTER — Encounter: Payer: Self-pay | Admitting: Gastroenterology

## 2021-11-22 ENCOUNTER — Other Ambulatory Visit (INDEPENDENT_AMBULATORY_CARE_PROVIDER_SITE_OTHER): Payer: 59

## 2021-11-22 VITALS — BP 114/66 | HR 70 | Ht 70.0 in | Wt 215.0 lb

## 2021-11-22 DIAGNOSIS — R1011 Right upper quadrant pain: Secondary | ICD-10-CM | POA: Diagnosis not present

## 2021-11-22 LAB — HEPATIC FUNCTION PANEL
ALT: 17 U/L (ref 0–53)
AST: 15 U/L (ref 0–37)
Albumin: 4.3 g/dL (ref 3.5–5.2)
Alkaline Phosphatase: 67 U/L (ref 39–117)
Bilirubin, Direct: 0.2 mg/dL (ref 0.0–0.3)
Total Bilirubin: 1.4 mg/dL — ABNORMAL HIGH (ref 0.2–1.2)
Total Protein: 6.7 g/dL (ref 6.0–8.3)

## 2021-11-22 NOTE — Patient Instructions (Signed)
You have been scheduled for an abdominal ultrasound at [facility] (1st floor of hospital) on Wednesday 11/27/21 at 8:00 AM. Please arrive 30 minutes prior to your appointment for registration. Make certain not to have anything to eat or drink 6 hours prior to your appointment. Should you need to reschedule your appointment, please contact radiology at [phone]. This test typically takes about 30 minutes to perform. ? ?Your provider has requested that you go to the basement level for lab work before leaving today. Press "B" on the elevator. The lab is located at the first door on the left as you exit the elevator. ? ?If you are age 25 or older, your body mass index should be between 23-30. Your Body mass index is 30.85 kg/m?[name] If this is out of the aforementioned range listed, please consider follow up with your Primary Care Provider. ? ?If you are age 67 or younger, your body mass index should be between 19-25. Your Body mass index is 30.85 kg/m?[name] If this is out of the aformentioned range listed, please consider follow up with your Primary Care Provider.  ? ?________________________________________________________ ? ?The [facility] providers would like to encourage you to use [facility] to communicate with providers for non-urgent requests or questions.  Due to long hold times on the telephone, sending your provider a message by [facility] may be a faster and more efficient way to get a response.  Please allow 48 business hours for a response.  Please remember that this is for non-urgent requests.  ?_______________________________________________________ ?Due to recent changes in healthcare laws, you may see the results of your imaging and laboratory studies on MyChart before your provider has had a chance to review them.  We understand that in some cases there may be results that are confusing or concerning to you. Not all laboratory results come back in the same time frame and the provider may be waiting  for multiple results in order to interpret others.  Please give [place] 48 hours in order for your provider to thoroughly review all the results before contacting the office for clarification of your results.  ? ?It was a pleasure to see you today! ? ?Thank you for trusting me with your gastrointestinal care!   ? ? ?

## 2021-11-22 NOTE — Progress Notes (Signed)
? ? ?[facility] Consult Note: ? ?History: ?Corey Holland ?11/22/2021 ? ?Referring provider: Corky Downs, MD ? ?Reason for consult/chief complaint: Abdominal Pain (Intermittent RUQ sharp abd pains x 1 year. Patient states he cannot relate the pain to any foods. ) ? ? ?Subjective  ?HPI: ? ?This is a very pleasant 26 year old man self-referred for right upper quadrant pain.  About a year ago it happened briefly for a day or 2 and he did not think much of it.  In the last few months he has had 2 episodes that have occurred acutely described as sharp and nonradiating with no clear triggers or relieving factors.  He does not feel short of breath and it is not happening with exertion.  He denies early satiety, nausea vomiting altered bowel habits or rectal bleeding.  He has not had an injury or notices the pain more with certain movements.  He works on a [employer], but says he gets up and moves around at least every hour. ? ? ? ?ROS: ? ?Review of Systems  ?Constitutional:  Negative for appetite change and unexpected weight change.  ?HENT:  Negative for mouth sores and voice change.   ?Eyes:  Negative for pain and redness.  ?Respiratory:  Negative for cough and shortness of breath.   ?Cardiovascular:  Negative for chest pain and palpitations.  ?Genitourinary:  Negative for dysuria and hematuria.  ?Musculoskeletal:  Negative for arthralgias and myalgias.  ?Skin:  Negative for pallor and rash.  ?Neurological:  Negative for weakness and headaches.  ?Hematological:  Negative for adenopathy.  ? ? ?Past Medical History: ?History reviewed. No pertinent past medical history. ?No chronic medical problems ? ?Past Surgical History: ?History reviewed. No pertinent surgical history. ?No prior surgery ? ?Family History: ?History reviewed. No pertinent family history. ? ?Social History: ?Social History  ? ?Socioeconomic History  ? Marital status: Unknown  ?  Spouse name: Not on file  ? Number of children: Not on file  ?  Years of education: Not on file  ? Highest education level: Not on file  ?Occupational History  ? Not on file  ?Tobacco Use  ? Smoking status: Never  ? Smokeless tobacco: Never  ?Substance and Sexual Activity  ? Alcohol use: Not Currently  ? Drug use: Never  ? Sexual activity: Not on file  ?Other Topics Concern  ? Not on file  ?Social History Narrative  ? Not on file  ? ?Social Determinants of Health  ? ?Financial Resource Strain: Not on file  ?Food Insecurity: Not on file  ?Transportation Needs: Not on file  ?Physical Activity: Not on file  ?Stress: Not on file  ?Social Connections: Not on file  ? ? ?Allergies: ?No Known Allergies ? ?Outpatient Meds: ?No current outpatient medications on file.  ? ?No current facility-administered medications for this visit.  ? ? ? ? ?___________________________________________________________________ ?Objective  ? ?Exam: ? ?BP 114/66   Pulse 70   Ht 5\' 10"  (1.778 m)   Wt 215 lb (97.5 kg)   BMI 30.85 kg/m?  ?Wt Readings from Last 3 Encounters:  ?11/22/21 215 lb (97.5 kg)  ?10/10/21 190 lb (86.2 kg)  ?08/01/21 190 lb (86.2 kg)  ? ? ?General: Well-appearing ?Eyes: sclera anicteric, no redness ?ENT: oral mucosa moist without lesions, no cervical or supraclavicular lymphadenopathy ?CV: RRR without murmur, S1/S2, no JVD, no peripheral edema ?Resp: clear to auscultation bilaterally, normal RR and effort noted ?GI: soft, he shows me the focal area of pain which is on the lower  aspect of the anterior chest wall in the midline toward the right side.  Although he feels that the pain may be inside or underneath that area.  There is no visible skin change, no mass or tenderness, with active bowel sounds. No guarding or palpable organomegaly noted. ?Skin; warm and dry, no rash or jaundice noted ?Neuro: awake, alert and oriented x 3. Normal gross motor function and fluent speech ? ?Labs: ?No data for review.  Says he has not seen his primary care physician lately and has had no testing for  the symptoms. ?Assessment: ?Encounter Diagnosis  ?Name Primary?  ? RUQ abdominal pain Yes  ? ? ?The area he points to his over the chest wall, though could still conceivably be biliary in nature, however he has no associated digestive symptoms and episodes are not triggered with eating ?No chronic dyspnea cough or exertional component. ?Plan: ? ?Hepatic function panel ?Right upper quadrant ultrasound ? ?If unremarkable, recommend he convene with primary care if these episodes continue. ? ?Thank you for the courtesy of this consult.  Please call me with any questions or concerns. ? ?Corey Holland III ? ?CC: Referring provider noted above ? ?

## 2021-11-27 ENCOUNTER — Ambulatory Visit (HOSPITAL_COMMUNITY): Payer: 59

## 2021-12-24 ENCOUNTER — Telehealth: Payer: Self-pay | Admitting: Orthopaedic Surgery

## 2021-12-24 NOTE — Telephone Encounter (Signed)
Pt called and was wondering if someone can call im about a referral  ? ?CB [phone]  ?

## 2021-12-24 NOTE — Telephone Encounter (Signed)
Possibly calling about this from dr yates note : If he calls we can proceed with epidural for the left L4-5 disc herniation ?LVM for pt to cb to discuss  ?

## 2021-12-25 ENCOUNTER — Ambulatory Visit (HOSPITAL_COMMUNITY): Admission: RE | Admit: 2021-12-25 | Payer: 59 | Source: Ambulatory Visit

## 2021-12-30 ENCOUNTER — Telehealth: Payer: Self-pay | Admitting: Radiology

## 2021-12-30 DIAGNOSIS — M5116 Intervertebral disc disorders with radiculopathy, lumbar region: Secondary | ICD-10-CM

## 2021-12-30 NOTE — Telephone Encounter (Signed)
Chumley, Carlon Cori Razor, Lovilia, RT ?Pt is wanting a referral for an epidural. Dr Ophelia Charter mentioned this in his last OV note.  ?Pt wants to go to a [facility] clinic and only gave me the fax  ? Number.  [phone]  ? ? ?Referral entered.  Per note in chart, If he calls we can proceed with epidural for the left L4-5 disc herniation.    ? ?Sabrina-could you please fax referral to number patient listed? ?Thanks. ?

## 2021-12-30 NOTE — Telephone Encounter (Signed)
Faxed referral to [facility] as requested ?

## 2022-01-16 ENCOUNTER — Other Ambulatory Visit: Payer: Self-pay | Admitting: Internal Medicine

## 2022-01-16 DIAGNOSIS — G4489 Other headache syndrome: Secondary | ICD-10-CM

## 2022-01-24 ENCOUNTER — Other Ambulatory Visit (HOSPITAL_COMMUNITY): Payer: Self-pay | Admitting: Internal Medicine

## 2022-01-24 DIAGNOSIS — G4489 Other headache syndrome: Secondary | ICD-10-CM

## 2022-02-14 ENCOUNTER — Ambulatory Visit (HOSPITAL_COMMUNITY)
Admission: RE | Admit: 2022-02-14 | Discharge: 2022-02-14 | Disposition: A | Discharge status: Home / Self Care | Payer: 59 | Source: Ambulatory Visit | Attending: Internal Medicine | Admitting: Internal Medicine

## 2022-02-14 DIAGNOSIS — G4489 Other headache syndrome: Secondary | ICD-10-CM | POA: Diagnosis present

## 2022-02-14 IMAGING — MR MR HEAD WO/W CM
6 of 12 series · 26 of 48 positions shown · IV contrast (gadavist)
Comparison: No pertinent prior exams available for comparison.

CLINICAL DATA: Provided history: Other headache syndrome.

EXAM:
MRI HEAD WITHOUT AND WITH CONTRAST
TECHNIQUE: Multiplanar, multiecho pulse sequences of the brain and surrounding
structures were obtained without and with intravenous contrast.
CONTRAST:  10mL GADAVIST GADOBUTROL 1 MMOL/ML IV SOLN

[Series 2: DWI · axial · 3.0mm · 0.94mm/px · z∈[-65,+82]mm · 8 of 100 slices shown (1 of 2)]
[im 1/100]
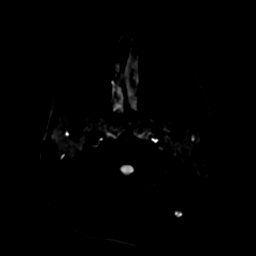
[im 15/100]
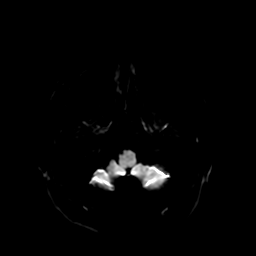
[im 29/100]
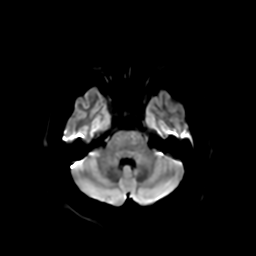
[im 43/100]
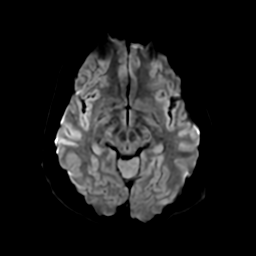
[im 57/100]
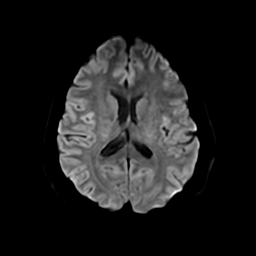
[im 71/100]
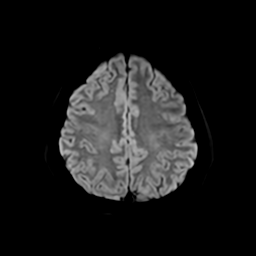
[im 85/100]
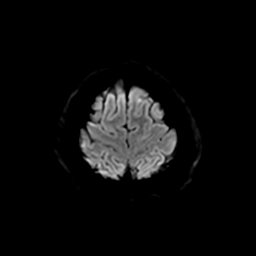
[im 100/100]
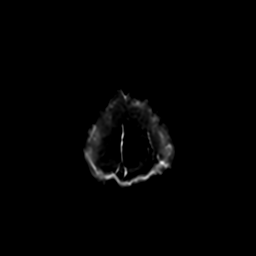

[Series 3: DWI · coronal · 4.0mm · 0.94mm/px · 6 of 72 slices shown (2 of 2)]
[im 1/72]
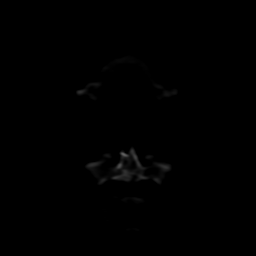
[im 15/72]
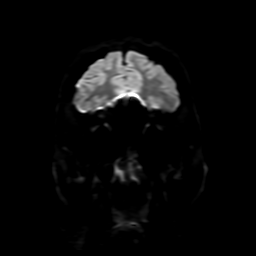
[im 29/72]
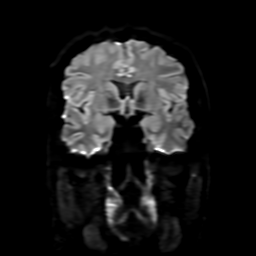
[im 43/72]
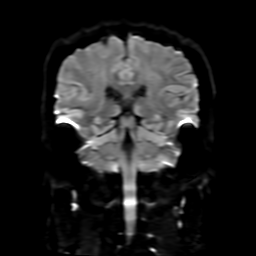
[im 57/72]
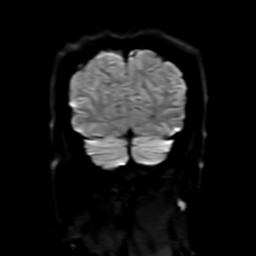
[im 72/72]
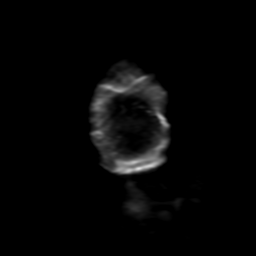

[Series 4: FLAIR · sagittal · 5.0mm · 0.23mm/px · 2 of 26 slices shown (1 of 2)]
[im 1/26]
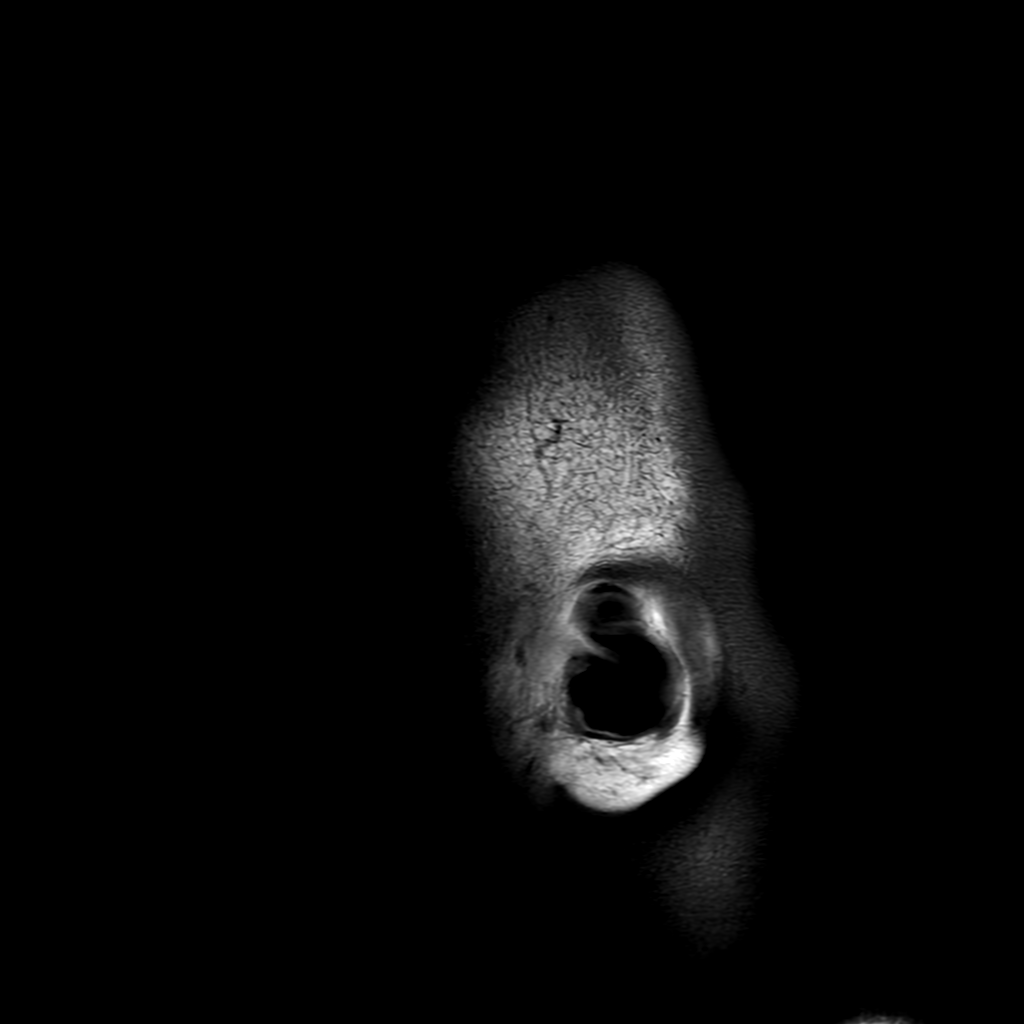
[im 26/26]
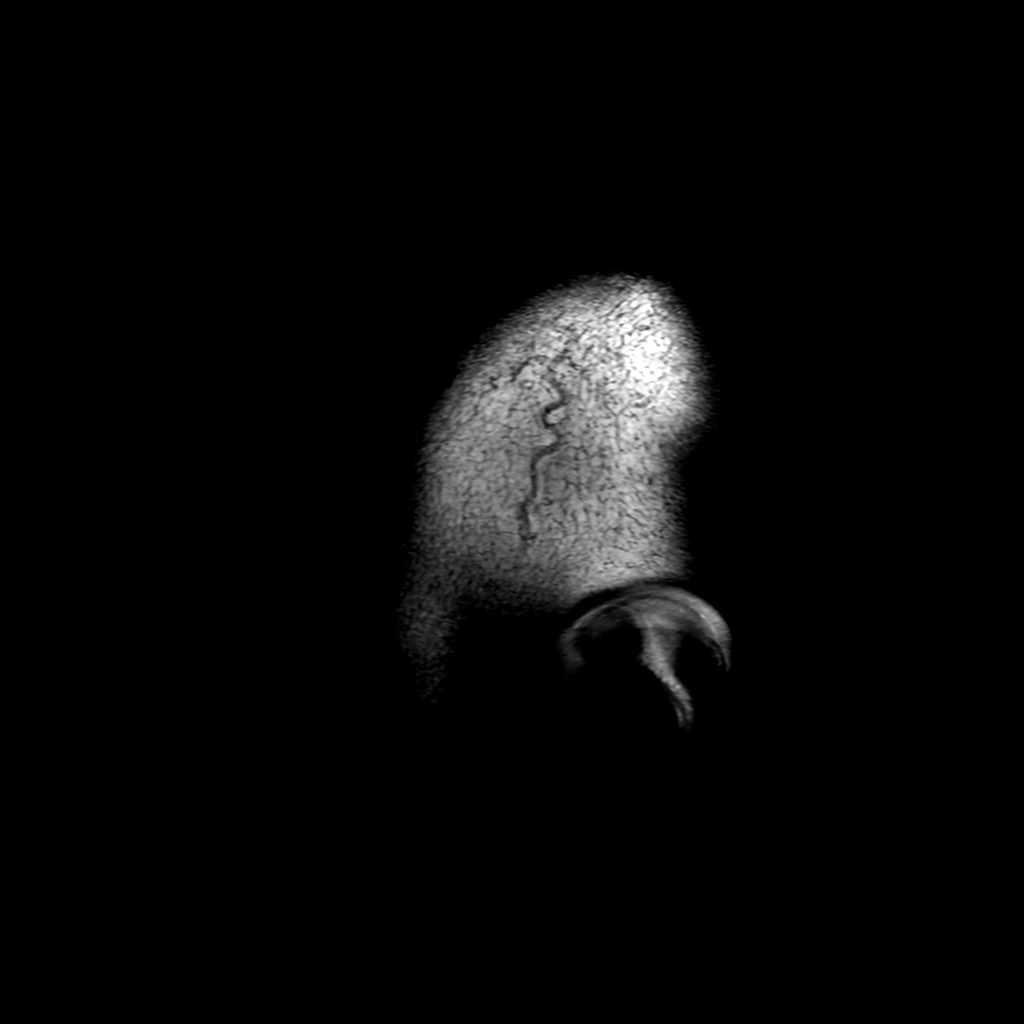

[Series 6: FLAIR · axial · 4.0mm · 0.45mm/px · z∈[-74,+76]mm · 3 of 35 slices shown (2 of 2)]
[im 1/35]
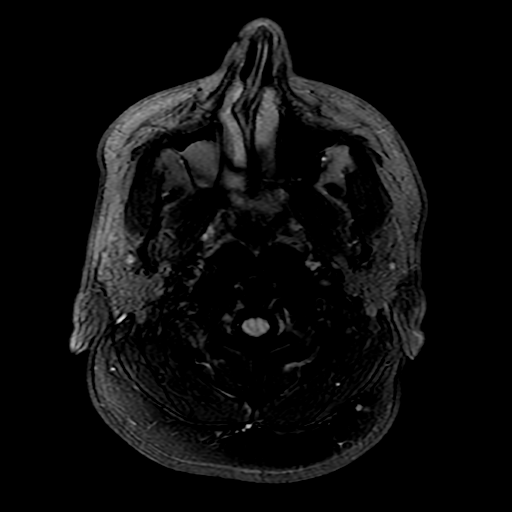
[im 18/35]
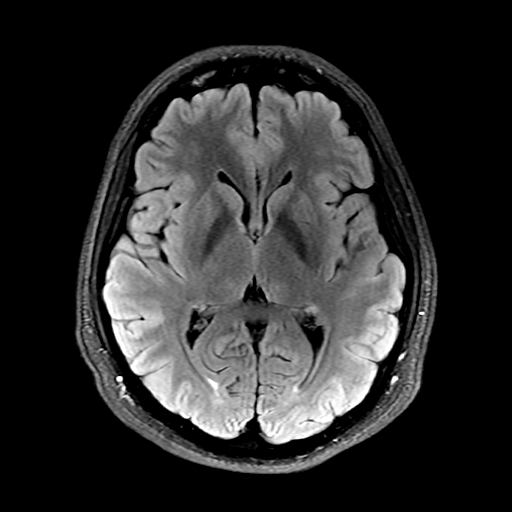
[im 35/35]
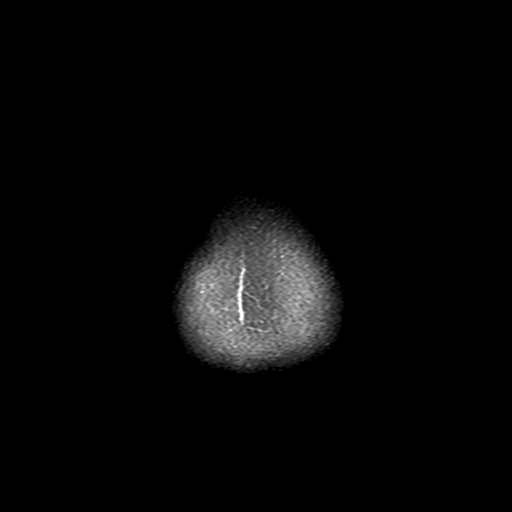

[Series 250: ADC · axial · 3.0mm · 0.94mm/px · z∈[-65,+79]mm · 4 of 48 slices shown (1 of 2)]
[im 1/48]
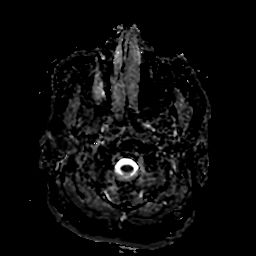
[im 16/48]
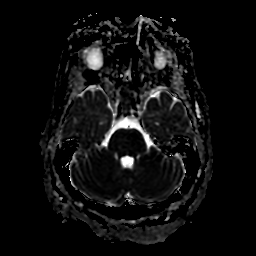
[im 32/48]
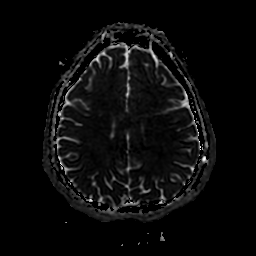
[im 48/48]
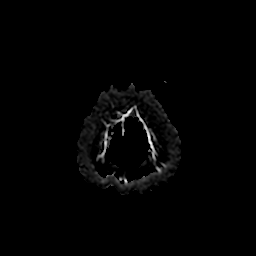

[Series 350: ADC · coronal · 4.0mm · 0.94mm/px · 3 of 36 slices shown (2 of 2)]
[im 1/36]
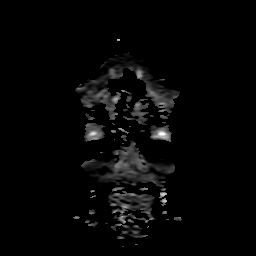
[im 18/36]
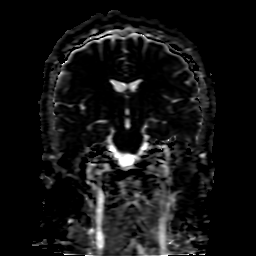
[im 36/36]
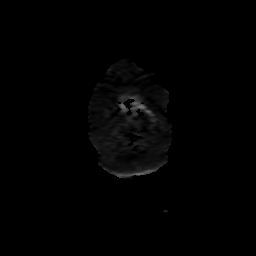

[26 of 48 positions shown; findings below may reference images not displayed]

FINDINGS: Brain:

Cerebral volume is normal.

No cortical encephalomalacia is identified. No significant cerebral
white matter disease.

There is no acute infarct.

No evidence of an intracranial mass.

No chronic intracranial blood products.

No extra-axial fluid collection.

No midline shift.

No pathologic intracranial enhancement identified.

Vascular: Maintained flow voids within the proximal large arterial
vessels. Right temporoparietal developmental venous anomaly (an
anatomic variant).

Skull and upper cervical spine: No focal suspicious marrow lesion.

Sinuses/Orbits: No mass or acute finding within the imaged orbits. 2
cm mucous retention cyst within the right maxillary sinus. Minimal
mucosal thickening within the bilateral ethmoid sinuses. 4 mm mucous
retention cyst within the left frontoethmoidal recess.
IMPRESSION: 1. No evidence of acute intracranial abnormality.
2. Right temporoparietal developmental venous anomaly (anatomic
variant).
3. Otherwise unremarkable MRI appearance of the brain.
4. Paranasal sinus disease, as described.

## 2022-02-14 MED ORDER — GADOBUTROL 1 MMOL/ML IV SOLN
10.0000 mL | Freq: Once | INTRAVENOUS | Status: AC | PRN
  Administered 2022-02-14: 10 mL via INTRAVENOUS
